# Patient Record
Sex: Male | Born: 1995 | Hispanic: No | Marital: Single | Smoking: Former smoker
Health system: Southern US, Community
[De-identification: ages and names within clinical notes are randomized; demographics above are authoritative.]

## PROBLEM LIST (undated history)

## (undated) DIAGNOSIS — J45909 Unspecified asthma, uncomplicated: Secondary | ICD-10-CM

## (undated) DIAGNOSIS — W3400XA Accidental discharge from unspecified firearms or gun, initial encounter: Secondary | ICD-10-CM

## (undated) HISTORY — PX: NEPHRECTOMY: SHX65

---
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
## 2015-06-01 ENCOUNTER — Emergency Department (HOSPITAL_COMMUNITY): Payer: Medicaid Other

## 2015-06-01 ENCOUNTER — Encounter (HOSPITAL_COMMUNITY): Payer: Self-pay | Admitting: Nurse Practitioner

## 2015-06-01 ENCOUNTER — Emergency Department (HOSPITAL_COMMUNITY)
Admission: EM | Admit: 2015-06-01 | Discharge: 2015-06-01 | Disposition: A | Discharge status: Home / Self Care | Payer: Medicaid Other | Attending: Emergency Medicine | Admitting: Emergency Medicine

## 2015-06-01 DIAGNOSIS — J45909 Unspecified asthma, uncomplicated: Secondary | ICD-10-CM | POA: Insufficient documentation

## 2015-06-01 DIAGNOSIS — S0990XA Unspecified injury of head, initial encounter: Secondary | ICD-10-CM | POA: Insufficient documentation

## 2015-06-01 DIAGNOSIS — Y9389 Activity, other specified: Secondary | ICD-10-CM | POA: Insufficient documentation

## 2015-06-01 DIAGNOSIS — R200 Anesthesia of skin: Secondary | ICD-10-CM | POA: Insufficient documentation

## 2015-06-01 DIAGNOSIS — Y998 Other external cause status: Secondary | ICD-10-CM | POA: Insufficient documentation

## 2015-06-01 DIAGNOSIS — Y9289 Other specified places as the place of occurrence of the external cause: Secondary | ICD-10-CM | POA: Insufficient documentation

## 2015-06-01 DIAGNOSIS — R042 Hemoptysis: Secondary | ICD-10-CM | POA: Insufficient documentation

## 2015-06-01 DIAGNOSIS — Z87891 Personal history of nicotine dependence: Secondary | ICD-10-CM | POA: Insufficient documentation

## 2015-06-01 HISTORY — DX: Unspecified asthma, uncomplicated: J45.909
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
## 2015-06-01 NOTE — ED Notes (Signed)
Patient transported to X-ray 
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
## 2015-12-05 DIAGNOSIS — W3400XA Accidental discharge from unspecified firearms or gun, initial encounter: Secondary | ICD-10-CM

## 2015-12-05 HISTORY — DX: Accidental discharge from unspecified firearms or gun, initial encounter: W34.00XA

## 2016-07-11 ENCOUNTER — Emergency Department (HOSPITAL_COMMUNITY)
Admission: EM | Admit: 2016-07-11 | Discharge: 2016-07-12 | Disposition: A | Discharge status: Home / Self Care | Payer: Medicaid Other | Attending: Emergency Medicine | Admitting: Emergency Medicine

## 2016-07-11 ENCOUNTER — Encounter (HOSPITAL_COMMUNITY): Payer: Self-pay

## 2016-07-11 DIAGNOSIS — Z5321 Procedure and treatment not carried out due to patient leaving prior to being seen by health care provider: Secondary | ICD-10-CM | POA: Insufficient documentation

## 2016-07-11 DIAGNOSIS — R1012 Left upper quadrant pain: Secondary | ICD-10-CM | POA: Insufficient documentation

## 2016-07-11 LAB — COMPREHENSIVE METABOLIC PANEL
ALK PHOS: 52 U/L (ref 38–126)
ALT: 20 U/L (ref 17–63)
AST: 18 U/L (ref 15–41)
Albumin: 4.2 g/dL (ref 3.5–5.0)
Anion gap: 7 (ref 5–15)
BILIRUBIN TOTAL: 0.5 mg/dL (ref 0.3–1.2)
BUN: 12 mg/dL (ref 6–20)
CALCIUM: 9.3 mg/dL (ref 8.9–10.3)
CO2: 24 mmol/L (ref 22–32)
CREATININE: 0.95 mg/dL (ref 0.61–1.24)
Chloride: 108 mmol/L (ref 101–111)
GFR calc Af Amer: >60 mL/min (ref >60)
GFR calc non Af Amer: >60 mL/min (ref >60)
GLUCOSE: 102 mg/dL — AB (ref 65–99)
Potassium: 3.7 mmol/L (ref 3.5–5.1)
Sodium: 139 mmol/L (ref 135–145)
TOTAL PROTEIN: 7.8 g/dL (ref 6.5–8.1)

## 2016-07-11 LAB — CBC
HCT: 37.9 % — ABNORMAL LOW (ref 39.0–52.0)
HEMOGLOBIN: 12.4 g/dL — AB (ref 13.0–17.0)
MCH: 26.8 pg (ref 26.0–34.0)
MCHC: 32.7 g/dL (ref 30.0–36.0)
MCV: 81.9 fL (ref 78.0–100.0)
PLATELETS: 198 10*3/uL (ref 150–400)
RBC: 4.63 MIL/uL (ref 4.22–5.81)
RDW: 17.2 % — AB (ref 11.5–15.5)
WBC: 7.2 10*3/uL (ref 4.0–10.5)

## 2016-07-11 LAB — LIPASE, BLOOD: Lipase: 216 U/L — ABNORMAL HIGH (ref 11–51)

## 2016-07-11 NOTE — ED Triage Notes (Signed)
States left upper quad pain for 2 days has hx of pancreatitis with vomiting states feels like when he had pancreatitis one month ago. No fever no dysuria voiced no diarrhea voiced states took protonix which helped nausea.

## 2016-07-11 NOTE — ED Notes (Signed)
Pt called x2 for v/s recheck with no response from lobby 

## 2016-07-11 NOTE — ED Notes (Signed)
Pt called for V/s recheck no response from the lobby

## 2016-09-15 ENCOUNTER — Ambulatory Visit (HOSPITAL_COMMUNITY)
Admission: EM | Admit: 2016-09-15 | Discharge: 2016-09-15 | Disposition: A | Discharge status: Home / Self Care | Payer: Medicaid Other | Attending: Family Medicine | Admitting: Family Medicine

## 2016-09-15 ENCOUNTER — Encounter (HOSPITAL_COMMUNITY): Payer: Self-pay | Admitting: Emergency Medicine

## 2016-09-15 DIAGNOSIS — Z87891 Personal history of nicotine dependence: Secondary | ICD-10-CM | POA: Insufficient documentation

## 2016-09-15 DIAGNOSIS — R300 Dysuria: Secondary | ICD-10-CM | POA: Insufficient documentation

## 2016-09-15 DIAGNOSIS — Z881 Allergy status to other antibiotic agents status: Secondary | ICD-10-CM | POA: Insufficient documentation

## 2016-09-15 DIAGNOSIS — N481 Balanitis: Secondary | ICD-10-CM | POA: Insufficient documentation

## 2016-09-15 HISTORY — DX: Accidental discharge from unspecified firearms or gun, initial encounter: W34.00XA

## 2016-09-15 LAB — POCT URINALYSIS DIP (DEVICE)
Bilirubin Urine: NEGATIVE
Glucose, UA: NEGATIVE mg/dL
Hgb urine dipstick: NEGATIVE
Ketones, ur: NEGATIVE mg/dL
Leukocytes, UA: NEGATIVE
Nitrite: NEGATIVE
PROTEIN: NEGATIVE mg/dL
SPECIFIC GRAVITY, URINE: 1.01 (ref 1.005–1.030)
UROBILINOGEN UA: 0.2 mg/dL (ref 0.0–1.0)
pH: 7 (ref 5.0–8.0)

## 2016-09-15 MED ORDER — NYSTATIN 100000 UNIT/GM EX CREA: TOPICAL_CREAM | CUTANEOUS | 0 refills | Status: AC
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
## 2016-09-15 NOTE — ED Notes (Signed)
Dirty and clean urine collected. 

## 2016-09-15 NOTE — Discharge Instructions (Signed)
I am treating your for balanitis, I prescribed nystatin cream, apply to the affected area twice daily. Your urine has been sent for testing for various types of STDs, if there is anything positive, in 3-5 business days. If your symptoms persist past one week, return to clinic as necessary.
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
## 2016-09-17 LAB — URINE CYTOLOGY ANCILLARY ONLY
Chlamydia: NEGATIVE
Neisseria Gonorrhea: NEGATIVE
Trichomonas: NEGATIVE

## 2020-01-02 ENCOUNTER — Telehealth (INDEPENDENT_AMBULATORY_CARE_PROVIDER_SITE_OTHER): Payer: Self-pay | Admitting: Primary Care

## 2021-08-17 ENCOUNTER — Encounter (HOSPITAL_COMMUNITY): Payer: Self-pay

## 2021-08-17 ENCOUNTER — Emergency Department (HOSPITAL_COMMUNITY)
Admission: EM | Admit: 2021-08-17 | Discharge: 2021-08-17 | Disposition: A | Discharge status: Home / Self Care | Payer: Self-pay | Attending: Emergency Medicine | Admitting: Emergency Medicine

## 2021-08-17 ENCOUNTER — Other Ambulatory Visit: Payer: Self-pay

## 2021-08-17 ENCOUNTER — Emergency Department (HOSPITAL_COMMUNITY): Payer: Self-pay

## 2021-08-17 DIAGNOSIS — S8991XA Unspecified injury of right lower leg, initial encounter: Secondary | ICD-10-CM | POA: Insufficient documentation

## 2021-08-17 DIAGNOSIS — R202 Paresthesia of skin: Secondary | ICD-10-CM | POA: Insufficient documentation

## 2021-08-17 DIAGNOSIS — W109XXA Fall (on) (from) unspecified stairs and steps, initial encounter: Secondary | ICD-10-CM | POA: Insufficient documentation

## 2021-08-17 MED ORDER — NAPROXEN 250 MG PO TABS
500.0000 mg | ORAL_TABLET | Freq: Once | ORAL | Status: AC
  Administered 2021-08-17: 500 mg via ORAL
  Filled 2021-08-17: qty 2

## 2021-08-17 NOTE — ED Notes (Signed)
Reviewed discharge instructions with patient. Follow-up care reviewed. Patient verbalized understanding. Patient A&Ox4, VSS, and ambulatory with steady gait upon discharge.  

## 2021-08-17 NOTE — ED Triage Notes (Signed)
Patient complains of ongoing right knmee pain and swelling, fell on steps this past Saturday with increased discomfort ?

## 2021-08-17 NOTE — Discharge Instructions (Addendum)
Please follow-up with an orthopedic provider.  If the ones on these discharge papers are going to be very expensive, it is also an option to get in touch with a [facility] or [facility] orthopedic office that may be able to offer cost friendly care. ? ?Read the information on these papers about at home therapy.  He should do your best to stay off of your leg and ice your knee whenever you are able. ?
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
## 2022-07-17 ENCOUNTER — Other Ambulatory Visit: Payer: Self-pay

## 2022-07-17 ENCOUNTER — Encounter (HOSPITAL_COMMUNITY): Payer: Self-pay

## 2022-07-17 ENCOUNTER — Emergency Department (HOSPITAL_COMMUNITY)
Admission: EM | Admit: 2022-07-17 | Discharge: 2022-07-17 | Discharge status: Against Medical Advice | Payer: Medicaid Other | Attending: Emergency Medicine | Admitting: Emergency Medicine

## 2022-07-17 DIAGNOSIS — F1010 Alcohol abuse, uncomplicated: Secondary | ICD-10-CM | POA: Insufficient documentation

## 2022-07-17 DIAGNOSIS — Z5321 Procedure and treatment not carried out due to patient leaving prior to being seen by health care provider: Secondary | ICD-10-CM | POA: Diagnosis not present

## 2022-07-17 DIAGNOSIS — Y900 Blood alcohol level of less than 20 mg/100 ml: Secondary | ICD-10-CM | POA: Diagnosis not present

## 2022-07-17 LAB — COMPREHENSIVE METABOLIC PANEL
ALT: 30 U/L (ref 0–44)
AST: 28 U/L (ref 15–41)
Albumin: 4.7 g/dL (ref 3.5–5.0)
Alkaline Phosphatase: 54 U/L (ref 38–126)
Anion gap: 13 (ref 5–15)
BUN: 13 mg/dL (ref 6–20)
CO2: 24 mmol/L (ref 22–32)
Calcium: 9.5 mg/dL (ref 8.9–10.3)
Chloride: 101 mmol/L (ref 98–111)
Creatinine, Ser: 1.08 mg/dL (ref 0.61–1.24)
GFR, Estimated: >60 mL/min (ref >60)
Glucose, Bld: 94 mg/dL (ref 70–99)
Potassium: 3.6 mmol/L (ref 3.5–5.1)
Sodium: 138 mmol/L (ref 135–145)
Total Bilirubin: 0.8 mg/dL (ref 0.3–1.2)
Total Protein: 8.7 g/dL — ABNORMAL HIGH (ref 6.5–8.1)

## 2022-07-17 LAB — CBC
HCT: 50.5 % (ref 39.0–52.0)
Hemoglobin: 17.2 g/dL — ABNORMAL HIGH (ref 13.0–17.0)
MCH: 31.3 pg (ref 26.0–34.0)
MCHC: 34.1 g/dL (ref 30.0–36.0)
MCV: 92 fL (ref 80.0–100.0)
Platelets: 267 10*3/uL (ref 150–400)
RBC: 5.49 MIL/uL (ref 4.22–5.81)
RDW: 11.5 % (ref 11.5–15.5)
WBC: 10 10*3/uL (ref 4.0–10.5)
nRBC: 0 % (ref 0.0–0.2)

## 2022-07-17 LAB — ETHANOL: Alcohol, Ethyl (B): 21 mg/dL — ABNORMAL HIGH (ref <10)
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
## 2024-04-09 ENCOUNTER — Ambulatory Visit: Payer: Self-pay

## 2024-04-09 ENCOUNTER — Telehealth: Payer: Self-pay | Admitting: General Practice

## 2024-04-09 NOTE — Telephone Encounter (Signed)
 Called pt and left vm to call office back to reschedule missed appt
